# Patient Record
Sex: Female | Born: 1995
Health system: Southern US, Community
[De-identification: ages and names within clinical notes are randomized; demographics above are authoritative.]

---
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
## 2017-08-27 IMAGING — DX DG ANKLE COMPLETE 3+V*R*
3 series · 3 of 3 positions shown · non-contrast
Comparison: None.

CLINICAL DATA: Fall during basketball game.

EXAM:
RIGHT ANKLE - COMPLETE 3+ VIEW

[ankle ap]
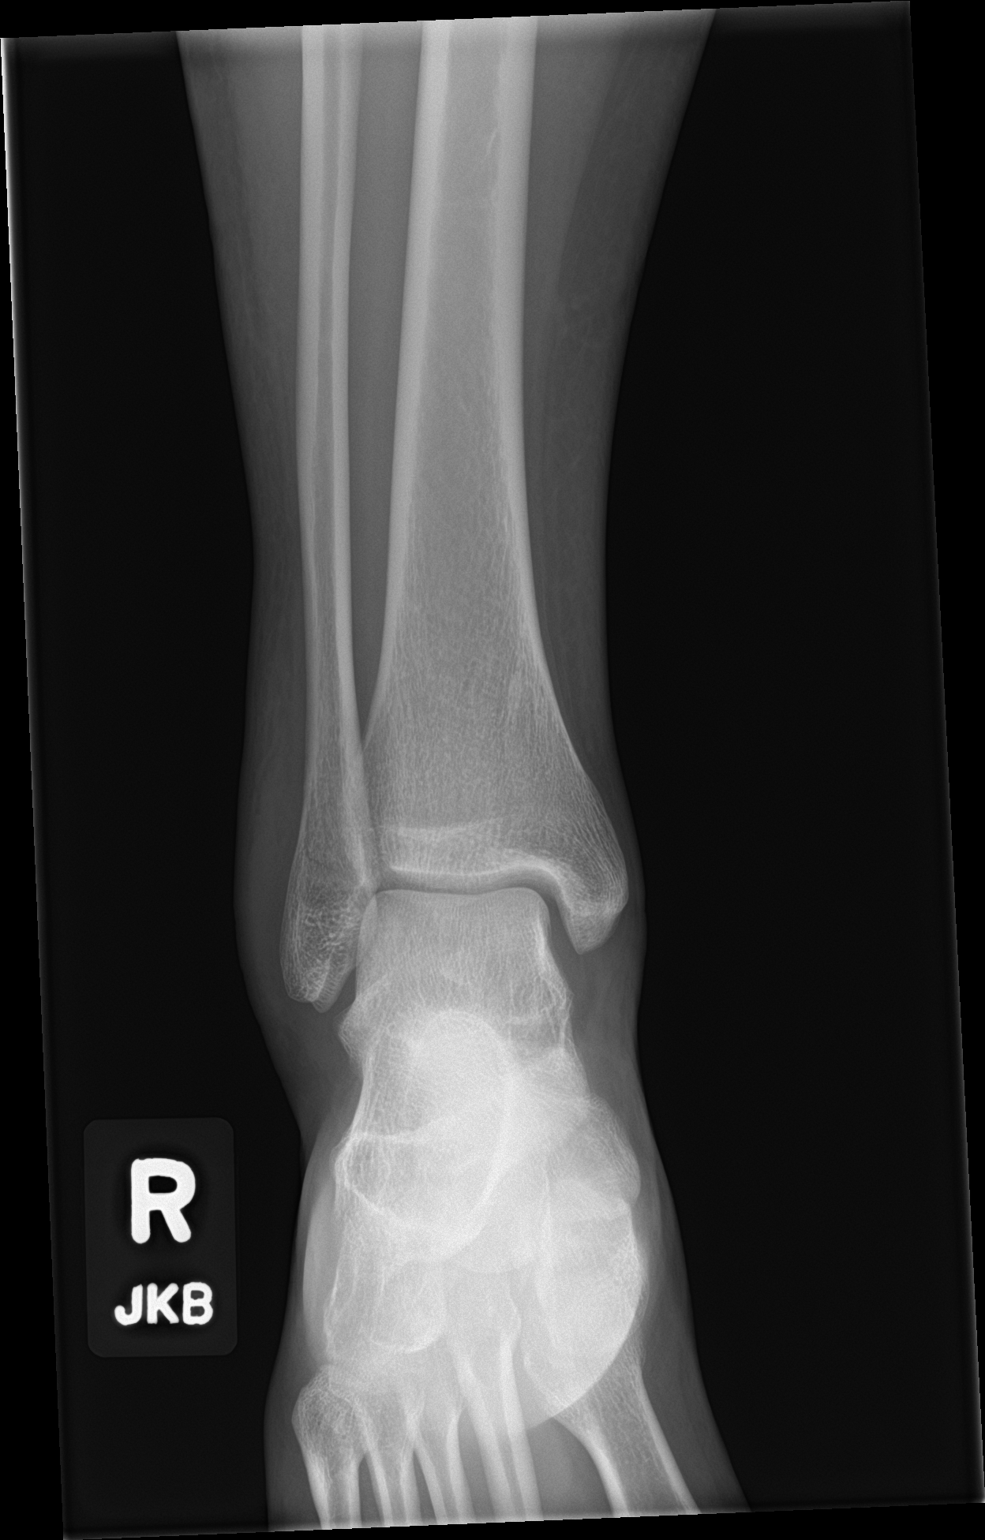

[ankle obl]
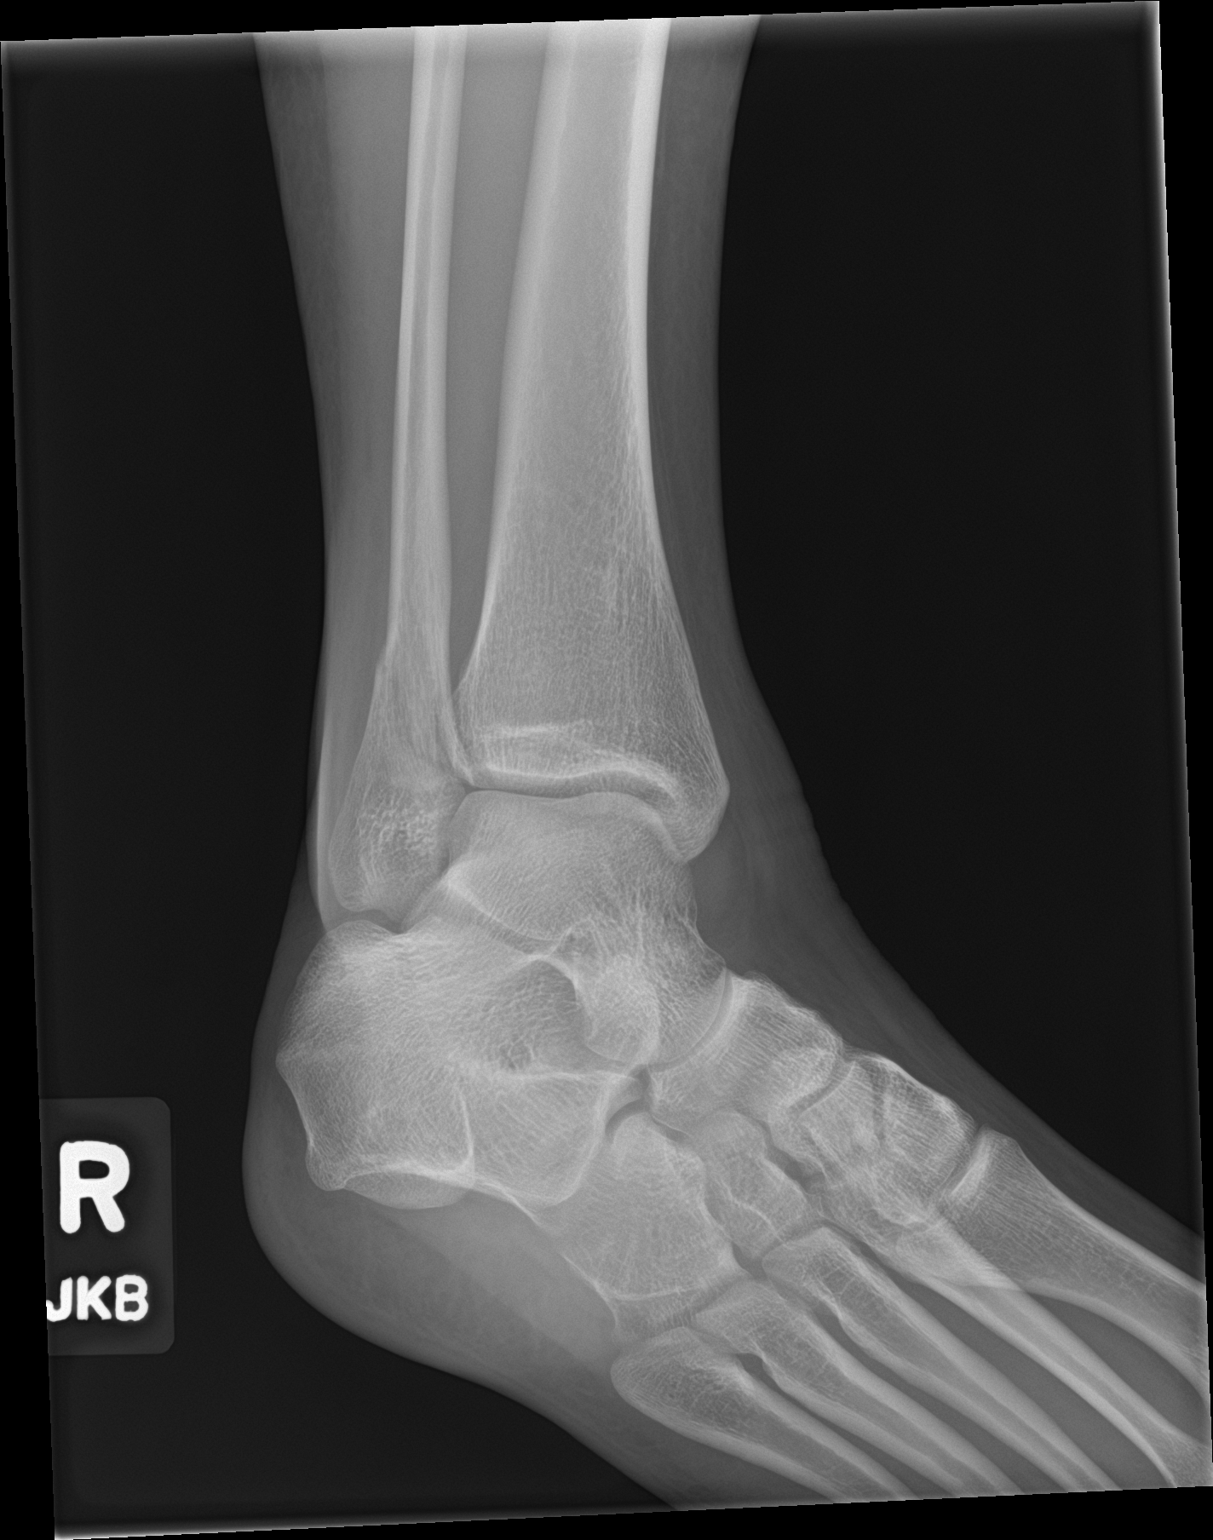

[ankle lat]
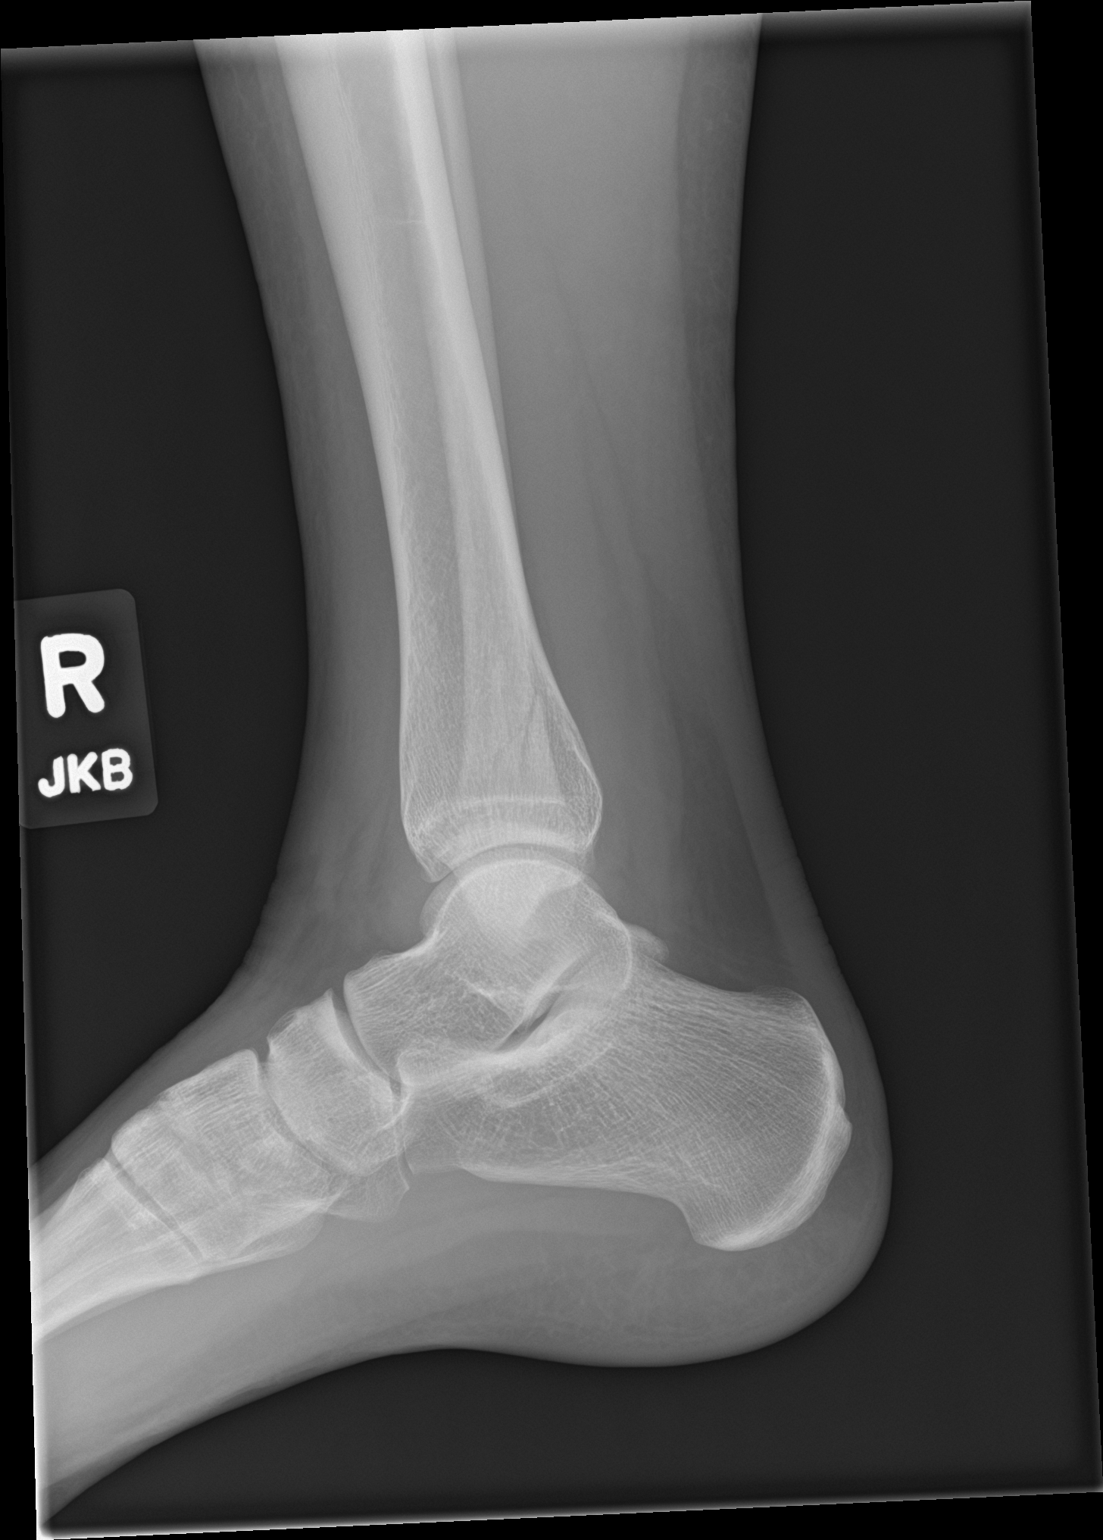

[3 of 3 positions shown; findings below may reference images not displayed]

FINDINGS: There is an oblique minimally comminuted and displaced fracture of
the distal fibula with intra-articular extension to lateral aspect
of ankle mortise. There is minimal posterolateral displacement of
the distal fracture fragment. There is an associated soft tissue
swelling and joint effusion.
IMPRESSION: Intra-articular oblique minimally comminuted and displaced fracture
of the distal fibula.

## 2019-06-23 ENCOUNTER — Ambulatory Visit (INDEPENDENT_AMBULATORY_CARE_PROVIDER_SITE_OTHER): Payer: 59 | Admitting: Physician Assistant

## 2019-06-23 ENCOUNTER — Encounter: Payer: Self-pay | Admitting: Physician Assistant

## 2019-06-23 ENCOUNTER — Other Ambulatory Visit: Payer: Self-pay

## 2019-06-23 VITALS — BP 130/70 | HR 104 | Temp 98.4°F | Resp 16 | Ht 70.0 in | Wt 141.0 lb

## 2019-06-23 DIAGNOSIS — F419 Anxiety disorder, unspecified: Secondary | ICD-10-CM | POA: Diagnosis not present

## 2019-06-23 MED ORDER — CITALOPRAM HYDROBROMIDE 10 MG PO TABS: 10.0000 mg | ORAL_TABLET | Freq: Every day | ORAL | 2 refills | Status: DC

## 2019-06-23 NOTE — Assessment & Plan Note (Signed)
Will try citalopram 10 mg qd Call in 3-4 weeks to notify how med working Follow up in 3 months

## 2019-06-23 NOTE — Progress Notes (Signed)
Acute Office Visit  Subjective:    Patient ID: Kara Mayo, female    DOB: [birth year], 24 y.o.   MRN: [id number]  Chief Complaint  Patient presents with  . Anxiety    HPI Patient is in today for anxiety Pt states that she has had anxiety symptoms since she was younger but over the past several weeks has worsened.  She has a nervous habit of picking at her thumb and that is causing pain and inflammation.  She states she does not have trouble sleeping and no real depressive symptoms.   She does worry a lot about things that are out of her control.  Would like to try medication to help with this.  History reviewed. No pertinent past medical history.  History reviewed. No pertinent surgical history.  Family History  Problem Relation Age of Onset  . Hypertension Other   . Cancer Other   . Diabetes type I Other     Social History   Socioeconomic History  . Marital status: Single    Spouse name: Not on file  . Number of children: Not on file  . Years of education: Not on file  . Highest education level: Not on file  Occupational History    Employer: [employer]  Tobacco Use  . Smoking status: Never Smoker  . Smokeless tobacco: Never Used  Substance and Sexual Activity  . Alcohol use: No  . Drug use: Never  . Sexual activity: Not on file  Other Topics Concern  . Not on file  Social History Narrative  . Not on file   Social Determinants of Health   Financial Resource Strain:   . Difficulty of Paying Living Expenses:   Food Insecurity:   . Worried About Programme researcher, broadcasting/film/video in the Last Year:   . Barista in the Last Year:   Transportation Needs:   . Freight forwarder (Medical):   Marland Kitchen Lack of Transportation (Non-Medical):   Physical Activity:   . Days of Exercise per Week:   . Minutes of Exercise per Session:   Stress:   . Feeling of Stress :   Social Connections:   . Frequency of Communication with Friends and Family:   . Frequency of Social  Gatherings with Friends and Family:   . Attends Religious Services:   . Active Member of Clubs or Organizations:   . Attends Banker Meetings:   Marland Kitchen Marital Status:   Intimate Partner Violence:   . Fear of Current or Ex-Partner:   . Emotionally Abused:   Marland Kitchen Physically Abused:   . Sexually Abused:      Current Outpatient Medications:  .  citalopram (CELEXA) 10 MG tablet, Take 1 tablet (10 mg total) by mouth daily., Disp: 30 tablet, Rfl: 2   No Known Allergies  CONSTITUTIONAL: Negative for chills, fatigue, fever, unintentional weight gain and unintentional weight loss.   CARDIOVASCULAR: Negative for chest pain, dizziness, palpitations and pedal edema.  RESPIRATORY: Negative for recent cough and dyspnea.   MSK: Negative for arthralgias and myalgias.   PSYCHIATRIC:see HPI    Objective:    PHYSICAL EXAM:   VS: BP 130/70   Pulse (!) 104   Temp 98.4 F (36.9 C)   Resp 16   Ht 5\' 10"  (1.778 m)   Wt 141 lb (64 kg)   SpO2 99%   BMI 20.23 kg/m   GEN: Well nourished, well developed, in no acute distress  Cardiac: RRR; no murmurs,  rubs, or gallops,no edema - no significant varicosities Respiratory:  normal respiratory rate and pattern with no distress - normal breath sounds with no rales, rhonchi, wheezes or rubs Neuro:  Alert and Oriented x 3, Strength and sensation are intact - CN II-Xii grossly intact Psych: euthymic mood, appropriate affect and demeanor   Wt Readings from Last 3 Encounters:  06/23/19 141 lb (64 kg)  03/07/16 150 lb (68 kg) (84 %, Z= 0.99)*   * Growth percentiles are based on CDC (Girls, 2-20 Years) data.    Health Maintenance Due  Topic Date Due  . HIV Screening  Never done  . TETANUS/TDAP  Never done  . PAP-Cervical Cytology Screening  Never done  . PAP SMEAR-Modifier  Never done    There are no preventive care reminders to display for this patient.        Assessment & Plan:   Problem List Items Addressed This Visit       Other   Anxiety - Primary    Will try citalopram 10 mg qd Call in 3-4 weeks to notify how med working Follow up in 3 months      Relevant Medications   citalopram (CELEXA) 10 MG tablet       Meds ordered this encounter  Medications  . citalopram (CELEXA) 10 MG tablet    Sig: Take 1 tablet (10 mg total) by mouth daily.    Dispense:  30 tablet    Refill:  2    Order Specific Question:   Supervising Provider    Answer:   Marge Duncans [[id number]]     [place], PA-C

## 2019-09-19 ENCOUNTER — Encounter: Payer: Self-pay | Admitting: Physician Assistant

## 2019-09-19 ENCOUNTER — Other Ambulatory Visit: Payer: Self-pay

## 2019-09-19 ENCOUNTER — Ambulatory Visit (INDEPENDENT_AMBULATORY_CARE_PROVIDER_SITE_OTHER): Payer: 59 | Admitting: Physician Assistant

## 2019-09-19 VITALS — BP 110/62 | HR 72 | Temp 98.4°F | Ht 70.0 in | Wt 138.0 lb

## 2019-09-19 DIAGNOSIS — F419 Anxiety disorder, unspecified: Secondary | ICD-10-CM

## 2019-09-19 NOTE — Assessment & Plan Note (Signed)
Continue current meds Follow up in 5 months

## 2019-09-19 NOTE — Progress Notes (Signed)
Acute Office Visit  Subjective:    Patient ID: Kara Mayo, female    DOB: 14-Mar-1996, 24 y.o.   MRN: [id number]  Chief Complaint  Patient presents with  . Anxiety    3 month follow up    HPI Patient is in today for anxiety Pt here for recheck after starting celex - currently on 10mg  qd - states she is doing well on the med - can tell she is not as stressed as before, not picking at her fingers and tolerating medication well Voices no concerns or problems today  History reviewed. No pertinent past medical history.  History reviewed. No pertinent surgical history.  Family History  Problem Relation Age of Onset  . Hypertension Other   . Cancer Other   . Diabetes type I Other     Social History   Socioeconomic History  . Marital status: Single    Spouse name: Not on file  . Number of children: Not on file  . Years of education: Not on file  . Highest education level: Not on file  Occupational History    Employer: [employer]  Tobacco Use  . Smoking status: Never Smoker  . Smokeless tobacco: Never Used  Vaping Use  . Vaping Use: Never used  Substance and Sexual Activity  . Alcohol use: No  . Drug use: Never  . Sexual activity: Not on file  Other Topics Concern  . Not on file  Social History Narrative  . Not on file   Social Determinants of Health   Financial Resource Strain:   . Difficulty of Paying Living Expenses:   Food Insecurity:   . Worried About in the Last Year:   . Programme researcher, broadcasting/film/video in the Last Year:   Transportation Needs:   . Barista (Medical):   Freight forwarder Lack of Transportation (Non-Medical):   Physical Activity:   . Days of Exercise per Week:   . Minutes of Exercise per Session:   Stress:   . Feeling of Stress :   Social Connections:   . Frequency of Communication with Friends and Family:   . Frequency of Social Gatherings with Friends and Family:   . Attends Religious Services:   . Active Member of  Clubs or Organizations:   . Attends Marland Kitchen Meetings:   Banker Marital Status:   Intimate Partner Violence:   . Fear of Current or Ex-Partner:   . Emotionally Abused:   Marland Kitchen Physically Abused:   . Sexually Abused:      Current Outpatient Medications:  .  citalopram (CELEXA) 10 MG tablet, Take 1 tablet (10 mg total) by mouth daily., Disp: 30 tablet, Rfl: 2   No Known Allergies  CONSTITUTIONAL: Negative for chills, fatigue, fever, unintentional weight gain and unintentional weight loss.   CARDIOVASCULAR: Negative for chest pain, dizziness, palpitations and pedal edema.  RESPIRATORY: Negative for recent cough and dyspnea.   MSK: Negative for arthralgias and myalgias.   PSYCHIATRIC:see HPI    Objective:    PHYSICAL EXAM:   VS: BP 110/62 (BP Location: Left Arm, Patient Position: Sitting)   Pulse 72   Temp 98.4 F (36.9 C) (Temporal)   Ht 5\' 10"  (1.778 m)   Wt 138 lb (62.6 kg)   SpO2 100%   BMI 19.80 kg/m   GEN: Well nourished, well developed, in no acute distress  Cardiac: RRR; no murmurs, rubs, or gallops,no edema - no significant varicosities Respiratory:  normal respiratory  rate and pattern with no distress - normal breath sounds with no rales, rhonchi, wheezes or rubs  Psych: euthymic mood, appropriate affect and demeanor   Wt Readings from Last 3 Encounters:  09/19/19 138 lb (62.6 kg)  06/23/19 141 lb (64 kg)  03/07/16 150 lb (68 kg) (84 %, Z= 0.99)*   * Growth percentiles are based on CDC (Girls, 2-20 Years) data.    Health Maintenance Due  Topic Date Due  . Hepatitis C Screening  Never done  . HIV Screening  Never done  . TETANUS/TDAP  Never done  . PAP-Cervical Cytology Screening  Never done  . PAP SMEAR-Modifier  Never done  . COVID-19 Vaccine (2 - Pfizer 2-dose series) 09/21/2019    There are no preventive care reminders to display for this patient.        Assessment & Plan:   Problem List Items Addressed This Visit      Other    Anxiety - Primary    Continue current meds Follow up in 5 months          No orders of the defined types were placed in this encounter.    SARA R Rachael Ferrie, PA-C

## 2019-09-22 ENCOUNTER — Ambulatory Visit: Payer: 59 | Admitting: Physician Assistant

## 2019-10-05 ENCOUNTER — Other Ambulatory Visit: Payer: Self-pay | Admitting: Physician Assistant

## 2019-10-05 MED ORDER — CITALOPRAM HYDROBROMIDE 10 MG PO TABS: 10.0000 mg | ORAL_TABLET | Freq: Every day | ORAL | 1 refills | Status: DC

## 2020-03-20 ENCOUNTER — Encounter: Payer: Self-pay | Admitting: Physician Assistant

## 2020-03-20 ENCOUNTER — Ambulatory Visit (INDEPENDENT_AMBULATORY_CARE_PROVIDER_SITE_OTHER): Payer: 59 | Admitting: Physician Assistant

## 2020-03-20 ENCOUNTER — Other Ambulatory Visit: Payer: Self-pay

## 2020-03-20 VITALS — BP 110/64 | HR 55 | Temp 97.9°F | Ht 70.0 in | Wt 144.6 lb

## 2020-03-20 DIAGNOSIS — F419 Anxiety disorder, unspecified: Secondary | ICD-10-CM | POA: Diagnosis not present

## 2020-03-20 MED ORDER — CITALOPRAM HYDROBROMIDE 10 MG PO TABS: 10.0000 mg | ORAL_TABLET | Freq: Every day | ORAL | 1 refills | Status: DC

## 2020-03-20 NOTE — Progress Notes (Signed)
Acute Office Visit  Subjective:    Patient ID: Kara Mayo, female    DOB: [birth year], 24 y.o.   MRN: [id number]  Chief Complaint  Patient presents with  . 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 No pertinent past medical history.  History reviewed. No pertinent surgical history.  Family History  Problem Relation Age of Onset  . Hypertension Other   . Cancer Other   . Diabetes type I Other     Social History   Socioeconomic History  . Marital status: Single    Spouse name: Not on file  . Number of children: Not on file  . Years of education: Not on file  . Highest education level: Not on file  Occupational History    Employer: [employer]  Tobacco Use  . Smoking status: Never Smoker  . Smokeless tobacco: Never Used  Vaping Use  . Vaping Use: Never used  Substance and Sexual Activity  . Alcohol use: No  . Drug use: Never  . Sexual activity: Not on file  Other Topics Concern  . Not on file  Social History Narrative  . Not on file   Social Determinants of Health   Financial Resource Strain: Not on file  Food Insecurity: Not on file  Transportation Needs: Not on file  Physical Activity: Not on file  Stress: Not on file  Social Connections: Not on file  Intimate Partner Violence: Not on file    Outpatient Medications Prior to Visit  Medication Sig Dispense Refill  . citalopram (CELEXA) 10 MG tablet Take 1 tablet (10 mg total) by mouth daily. 90 tablet 1   No facility-administered medications prior to visit.    No Known Allergies  Review of Systems CONSTITUTIONAL: Negative for chills, fatigue, fever, unintentional weight gain and unintentional weight loss.  CARDIOVASCULAR: Negative for  chest pain, dizziness, palpitations and pedal edema.  RESPIRATORY: Negative for recent cough and dyspnea.  PSYCHIATRIC: Negative for sleep disturbance and to question depression screen.  Negative for depression, negative for anhedonia.         Objective:    Physical Exam PHYSICAL EXAM:   VS: BP 110/64 (BP Location: Right Arm, Patient Position: Sitting, Cuff Size: Normal)   Pulse (!) 55   Temp 97.9 F (36.6 C) (Temporal)   Ht 5' 10"  (1.778 m)   Wt 144 lb 9.6 oz (65.6 kg)   SpO2 99%   BMI 20.75 kg/m   GEN: Well nourished, well developed, in no acute distress  Cardiac: RRR; no murmurs, rubs, or gallops,no edema -  Respiratory:  normal respiratory rate and pattern with no distress - normal breath sounds with no rales, rhonchi, wheezes or rubs  Psych: euthymic mood, appropriate affect and demeanor  BP 110/64 (BP Location: Right Arm, Patient Position: Sitting, Cuff Size: Normal)   Pulse (!) 55   Temp 97.9 F (36.6 C) (Temporal)   Ht 5' 10"  (1.778 m)   Wt 144 lb 9.6 oz (65.6 kg)   SpO2 99%   BMI 20.75 kg/m  Wt Readings from Last 3 Encounters:  03/20/20 144 lb 9.6 oz (65.6 kg)  09/19/19 138 lb (62.6 kg)  06/23/19 141 lb (  64 kg)    Health Maintenance Due  Topic Date Due  . Hepatitis C Screening  Never done  . HIV Screening  Never done  . TETANUS/TDAP  Never done  . PAP-Cervical Cytology Screening  Never done  . PAP SMEAR-Modifier  Never done  . INFLUENZA VACCINE  Never done    There are no preventive care reminders to display for this patient.   No results found for: TSH No results found for: WBC, HGB, HCT, MCV, PLT No results found for: NA, K, CHLORIDE, CO2, GLUCOSE, BUN, CREATININE, BILITOT, ALKPHOS, AST, ALT, PROT, ALBUMIN, CALCIUM, ANIONGAP, EGFR, GFR No results found for: CHOL No results found for: HDL No results found for: LDLCALC No results found for: TRIG No results found for: CHOLHDL No results found for: HGBA1C     Assessment & Plan:  1. Anxiety -  citalopram (CELEXA) 10 MG tablet; Take 1 tablet (10 mg total) by mouth daily.  Dispense: 90 tablet; Refill: 1    Meds ordered this encounter  Medications  . citalopram (CELEXA) 10 MG tablet    Sig: Take 1 tablet (10 mg total) by mouth daily.    Dispense:  90 tablet    Refill:  1    Order Specific Question:   Supervising Provider    Answer:   Shelton Silvas    No orders of the defined types were placed in this encounter.     Follow-up: Return in about 6 months (around 09/18/2020) for follow up.  An After Visit Summary was printed and given to the patient.  Yetta Flock [facility] [phone]

## 2020-09-20 ENCOUNTER — Other Ambulatory Visit: Payer: Self-pay

## 2020-09-20 ENCOUNTER — Ambulatory Visit (INDEPENDENT_AMBULATORY_CARE_PROVIDER_SITE_OTHER): Payer: Managed Care, Other (non HMO) | Admitting: Physician Assistant

## 2020-09-20 ENCOUNTER — Encounter: Payer: Self-pay | Admitting: Physician Assistant

## 2020-09-20 DIAGNOSIS — F419 Anxiety disorder, unspecified: Secondary | ICD-10-CM | POA: Diagnosis not present

## 2020-09-20 MED ORDER — CITALOPRAM HYDROBROMIDE 10 MG PO TABS: 10.0000 mg | ORAL_TABLET | Freq: Every day | ORAL | 1 refills | Status: AC

## 2020-09-20 NOTE — Progress Notes (Signed)
Acute Office Visit  Subjective:    Patient ID: Kara Mayo, female    DOB: [birth year], 25 y.o.   MRN: [id number]  Chief Complaint  Patient presents with   Anxiety    Anxiety    Patient is in today for follow up of anxiety -  Pt is currently on celexa 43m qd - she states she is doing well on this medication and not having any breakthrough symptoms [facility] [facility]  History reviewed. No pertinent past medical history.  History reviewed. No pertinent surgical history.  Family History  Problem Relation Age of Onset   Hypertension Other    Cancer Other    Diabetes type I Other     Social History   Socioeconomic History   Marital status: Single    Spouse name: Not on file   Number of children: Not on file   Years of education: Not on file   Highest education level: Not on file  Occupational History    Employer: [employer]  Tobacco Use   Smoking status: Never   Smokeless tobacco: Never  Vaping Use   Vaping Use: Never used  Substance and Sexual Activity   Alcohol use: No   Drug use: Never   Sexual activity: Not on file  Other Topics Concern   Not on file  Social History Narrative   Not on file   Social Determinants of Health   Financial Resource Strain: Not on file  Food Insecurity: Not on file  Transportation Needs: Not on file  Physical Activity: Not on file  Stress: Not on file  Social Connections: Not on file  Intimate Partner Violence: Not on file    Outpatient Medications Prior to Visit  Medication Sig Dispense Refill   citalopram (CELEXA) 10 MG tablet Take 1 tablet (10 mg total) by mouth daily. 90 tablet 1   No facility-administered medications prior to visit.    No Known Allergies  Review of Systems CONSTITUTIONAL: Negative for chills, fatigue, fever, unintentional weight gain and unintentional weight loss.  CARDIOVASCULAR: Negative for chest pain, dizziness, palpitations and pedal  edema.  RESPIRATORY: Negative for recent cough and dyspnea.  PSYCHIATRIC: Negative for sleep disturbance and to question depression screen.  Negative for depression, negative for anhedonia.         Objective:    Physical Exam PHYSICAL EXAM:   VS: BP 102/60 (BP Location: Left Arm, Patient Position: Sitting, Cuff Size: Normal)   Pulse 70   Temp (!) 97.5 F (36.4 C) (Temporal)   Ht 5' 10"  (1.778 m)   Wt 149 lb 6.4 oz (67.8 kg)   LMP  (LMP Unknown)   SpO2 97%   BMI 21.44 kg/m   PHYSICAL EXAM:   VS: BP 102/60 (BP Location: Left Arm, Patient Position: Sitting, Cuff Size: Normal)   Pulse 70   Temp (!) 97.5 F (36.4 C) (Temporal)   Ht 5' 10"  (1.778 m)   Wt 149 lb 6.4 oz (67.8 kg)   LMP  (LMP Unknown)   SpO2 97%   BMI 21.44 kg/m   GEN: Well nourished, well developed, in no acute distress  Cardiac: RRR; no murmurs, rubs, or gallops, Respiratory:  normal respiratory rate and pattern with no distress - normal breath sounds with no rales, rhonchi, wheezes or rubs  Psych: euthymic mood, appropriate affect and demeanor   BP 102/60 (BP Location: Left Arm, Patient Position: Sitting, Cuff Size: Normal)   Pulse 70  Temp (!) 97.5 F (36.4 C) (Temporal)   Ht 5' 10"  (1.778 m)   Wt 149 lb 6.4 oz (67.8 kg)   LMP  (LMP Unknown)   SpO2 97%   BMI 21.44 kg/m  Wt Readings from Last 3 Encounters:  09/20/20 149 lb 6.4 oz (67.8 kg)  03/20/20 144 lb 9.6 oz (65.6 kg)  09/19/19 138 lb (62.6 kg)    There are no preventive care reminders to display for this patient.   There are no preventive care reminders to display for this patient.   No results found for: TSH No results found for: WBC, HGB, HCT, MCV, PLT No results found for: NA, K, CHLORIDE, CO2, GLUCOSE, BUN, CREATININE, BILITOT, ALKPHOS, AST, ALT, PROT, ALBUMIN, CALCIUM, ANIONGAP, EGFR, GFR No results found for: CHOL No results found for: HDL No results found for: LDLCALC No results found for: TRIG No results found for:  CHOLHDL No results found for: HGBA1C     Assessment & Plan:  1. Anxiety - citalopram (CELEXA) 10 MG tablet; Take 1 tablet (10 mg total) by mouth daily.  Dispense: 90 tablet; Refill: 1    Meds ordered this encounter  Medications   citalopram (CELEXA) 10 MG tablet    Sig: Take 1 tablet (10 mg total) by mouth daily.    Dispense:  90 tablet    Refill:  1    Order Specific Question:   Supervising Provider    Answer:   Shelton Silvas    No orders of the defined types were placed in this encounter.     Follow-up: Return in about 6 months (around 03/22/2021) for follow up.  An After Visit Summary was printed and given to the patient.  Yetta Flock [facility] [phone]

## 2021-03-26 ENCOUNTER — Ambulatory Visit: Payer: Managed Care, Other (non HMO) | Admitting: Physician Assistant
